# Patient Record
Sex: Female | Born: 1970 | Hispanic: No | Smoking: Current every day smoker
Health system: Southern US, Community
[De-identification: ages and names within clinical notes are randomized; demographics above are authoritative.]

## PROBLEM LIST (undated history)

## (undated) DIAGNOSIS — I10 Essential (primary) hypertension: Secondary | ICD-10-CM

## (undated) HISTORY — PX: CHOLECYSTECTOMY: SHX55

## (undated) HISTORY — PX: EYE SURGERY: SHX253

---

## 2006-06-17 ENCOUNTER — Emergency Department (HOSPITAL_COMMUNITY): Admission: EM | Admit: 2006-06-17 | Discharge: 2006-06-17 | Payer: Self-pay | Admitting: Emergency Medicine

## 2007-01-18 ENCOUNTER — Emergency Department (HOSPITAL_COMMUNITY): Admission: EM | Admit: 2007-01-18 | Discharge: 2007-01-18 | Payer: Self-pay | Admitting: Emergency Medicine

## 2009-12-21 ENCOUNTER — Emergency Department (HOSPITAL_COMMUNITY): Admission: EM | Admit: 2009-12-21 | Discharge: 2009-12-21 | Payer: Self-pay | Admitting: Emergency Medicine

## 2011-05-15 ENCOUNTER — Emergency Department (HOSPITAL_COMMUNITY)
Admission: EM | Admit: 2011-05-15 | Discharge: 2011-05-15 | Disposition: A | Discharge status: Home / Self Care | Payer: Self-pay | Attending: Emergency Medicine | Admitting: Emergency Medicine

## 2011-05-15 ENCOUNTER — Emergency Department (HOSPITAL_COMMUNITY): Payer: Self-pay

## 2011-05-15 DIAGNOSIS — K089 Disorder of teeth and supporting structures, unspecified: Secondary | ICD-10-CM | POA: Insufficient documentation

## 2011-05-15 DIAGNOSIS — R220 Localized swelling, mass and lump, head: Secondary | ICD-10-CM | POA: Insufficient documentation

## 2011-05-15 MED ORDER — IOHEXOL 300 MG/ML  SOLN
100.0000 mL | Freq: Once | INTRAMUSCULAR | Status: AC | PRN
  Administered 2011-05-15: 100 mL via INTRAVENOUS

## 2011-05-16 ENCOUNTER — Emergency Department (HOSPITAL_COMMUNITY)
Admission: EM | Admit: 2011-05-16 | Discharge: 2011-05-16 | Disposition: A | Discharge status: Home / Self Care | Payer: Self-pay | Attending: Emergency Medicine | Admitting: Emergency Medicine

## 2011-05-16 DIAGNOSIS — R220 Localized swelling, mass and lump, head: Secondary | ICD-10-CM | POA: Insufficient documentation

## 2011-05-16 DIAGNOSIS — K089 Disorder of teeth and supporting structures, unspecified: Secondary | ICD-10-CM | POA: Insufficient documentation

## 2011-05-16 LAB — POCT I-STAT, CHEM 8
BUN: 7 mg/dL (ref 6–23)
Calcium, Ion: 1.18 mmol/L (ref 1.12–1.32)
Chloride: 101 mEq/L (ref 96–112)
HCT: 46 % (ref 36.0–46.0)
TCO2: 23 mmol/L (ref 0–100)

## 2011-05-16 LAB — CBC
MCH: 31.1 pg (ref 26.0–34.0)
RBC: 4.72 MIL/uL (ref 3.87–5.11)
RDW: 12.6 % (ref 11.5–15.5)

## 2011-05-25 ENCOUNTER — Emergency Department (HOSPITAL_COMMUNITY)
Admission: EM | Admit: 2011-05-25 | Discharge: 2011-05-26 | Disposition: A | Discharge status: Home / Self Care | Payer: Self-pay | Attending: Emergency Medicine | Admitting: Emergency Medicine

## 2011-05-25 DIAGNOSIS — R599 Enlarged lymph nodes, unspecified: Secondary | ICD-10-CM | POA: Insufficient documentation

## 2011-05-25 DIAGNOSIS — K047 Periapical abscess without sinus: Secondary | ICD-10-CM | POA: Insufficient documentation

## 2011-05-25 DIAGNOSIS — R220 Localized swelling, mass and lump, head: Secondary | ICD-10-CM | POA: Insufficient documentation

## 2011-05-26 ENCOUNTER — Emergency Department (HOSPITAL_COMMUNITY): Payer: Self-pay

## 2011-05-26 ENCOUNTER — Encounter (HOSPITAL_COMMUNITY): Payer: Self-pay

## 2011-05-26 LAB — CBC
HCT: 31.8 % — ABNORMAL LOW (ref 36.0–46.0)
MCH: 30.2 pg (ref 26.0–34.0)
RDW: 12 % (ref 11.5–15.5)

## 2011-05-26 LAB — DIFFERENTIAL
Basophils Relative: 0 % (ref 0–1)
Eosinophils Absolute: 0.1 10*3/uL (ref 0.0–0.7)
Lymphocytes Relative: 25 % (ref 12–46)
Lymphs Abs: 2.7 10*3/uL (ref 0.7–4.0)
Monocytes Relative: 9 % (ref 3–12)

## 2011-05-26 LAB — BASIC METABOLIC PANEL
BUN: 8 mg/dL (ref 6–23)
Sodium: 136 mEq/L (ref 135–145)

## 2011-05-26 MED ORDER — IOHEXOL 300 MG/ML  SOLN
100.0000 mL | Freq: Once | INTRAMUSCULAR | Status: AC | PRN
  Administered 2011-05-26: 100 mL via INTRAVENOUS

## 2011-07-01 NOTE — Discharge Summary (Signed)
  NAME:  Emma, Ritter NO.:  [id number]  MEDICAL RECORD NO.:  [id number]  LOCATION:  [facility]                         FACILITY:  [facility]  PHYSICIAN:  Suzanna Obey, M.D.       DATE OF BIRTH:  [birth year]  DATE OF ADMISSION:  05/16/2011 DATE OF DISCHARGE:  05/16/2011                              DISCHARGE SUMMARY   I was called at 11:15 approximately by the physician assistant regarding a wisdom teeth removed on Thursday on this patient.  She began to tell me some of the information and I immediately asked why she was not calling all surgery, since it is clearly an oral  surgery problem and she said okay and hung up.          ______________________________ Suzanna Obey, M.D.     JB/MEDQ  D:  05/16/2011  T:  05/17/2011  Job:  [id number]  Electronically Signed by Suzanna Obey M.D. on 07/01/2011 09:17:06 AM

## 2012-11-10 IMAGING — CT CT MAXILLOFACIAL W/ CM
3 series · 16 of 47 positions shown, 19 images · IV contrast (agent unspecified)
Comparison: None.

CLINICAL DATA: 39-year-old with left sided cheek swelling. Recent
removal of the left wisdom tooth.

CT MAXILLOFACIAL WITH CONTRAST
TECHNIQUE: Multidetector CT imaging of the maxillofacial
structures was performed with intravenous contrast. Multiplanar CT
image reconstructions were also generated.
Contrast: 100 ml Lmnipaque-KFF

[Series 3: orbit 2.0 h32s · axial · 0.35mm/px · z∈[-250,-118]mm · 10 of 78 slices shown, 13 images]
[im 6/78  brain]
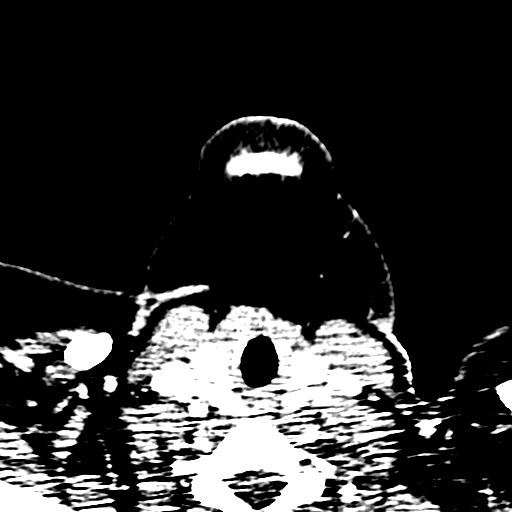
[im 6/78  bone]
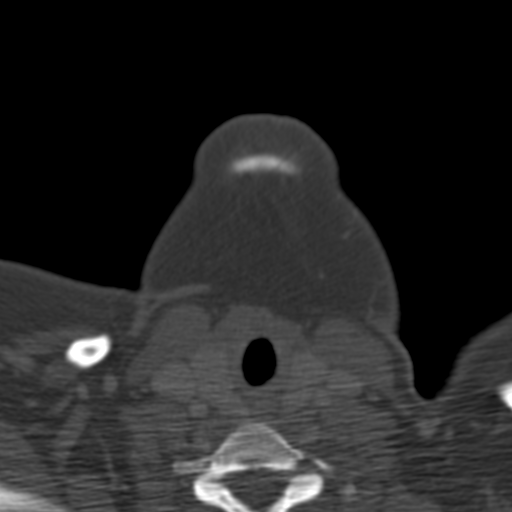
[im 14/78  bone]
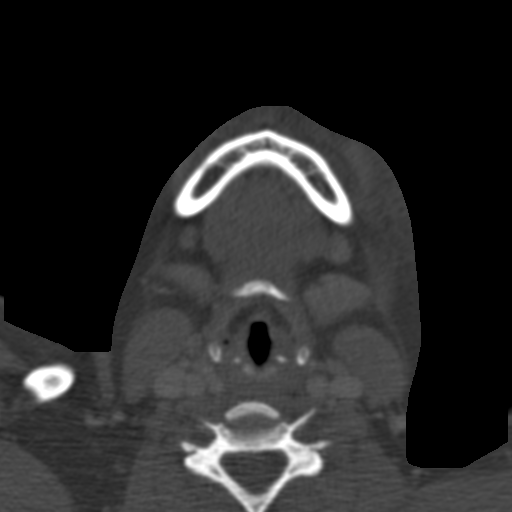
[im 22/78  bone]
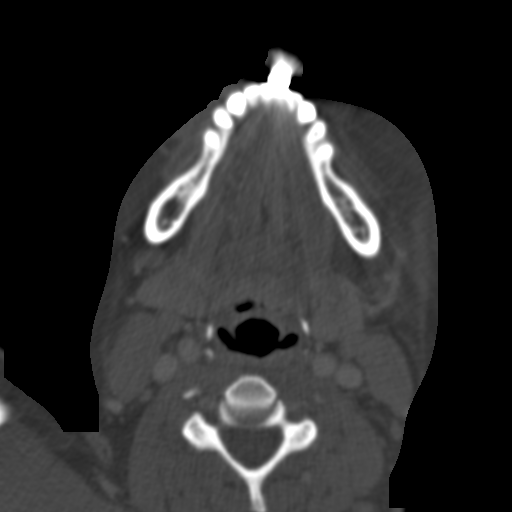
[im 27/78  bone]
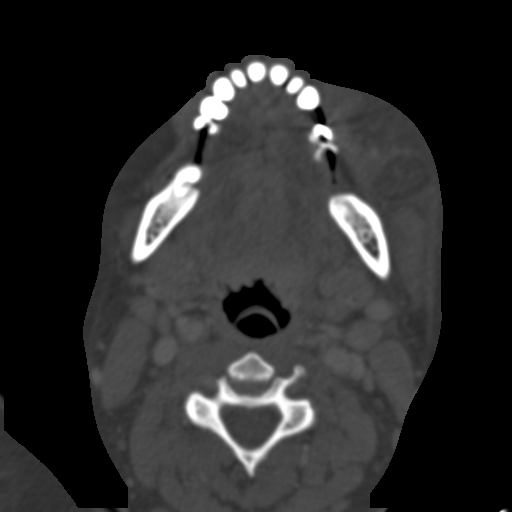
[im 35/78  brain]
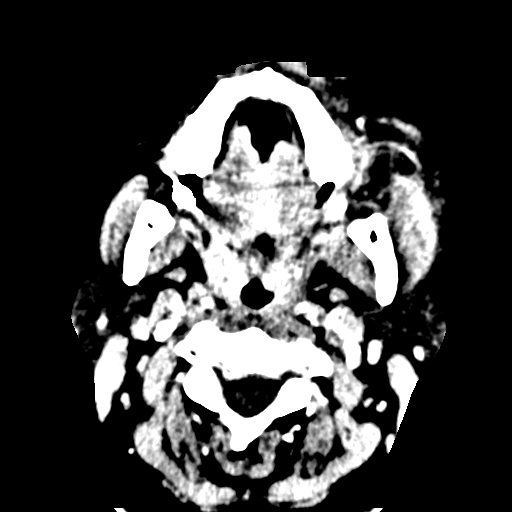
[im 35/78  bone]
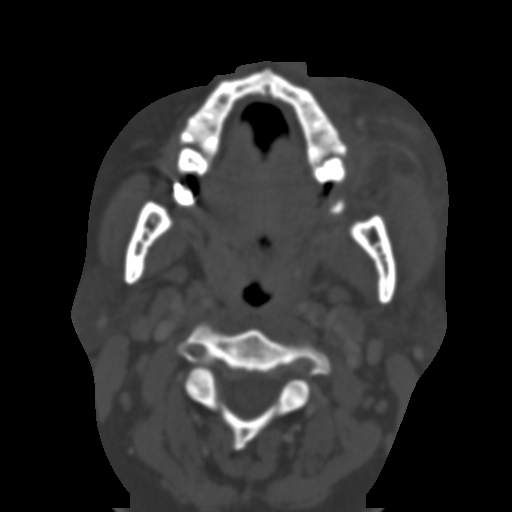
[im 43/78  bone]
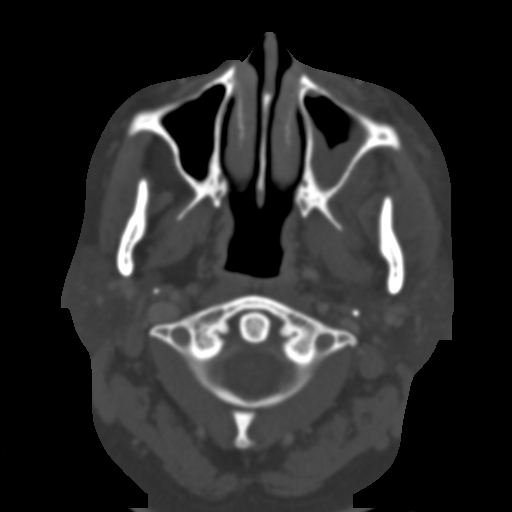
[im 51/78  bone]
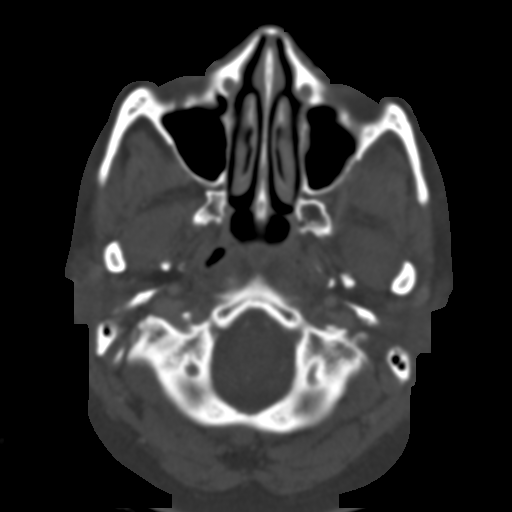
[im 59/78  bone]
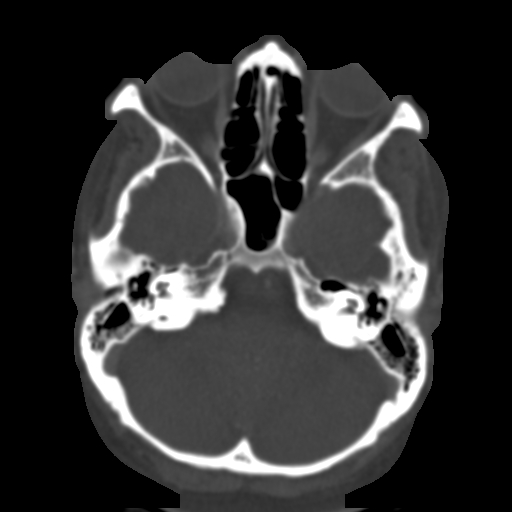
[im 64/78  brain]
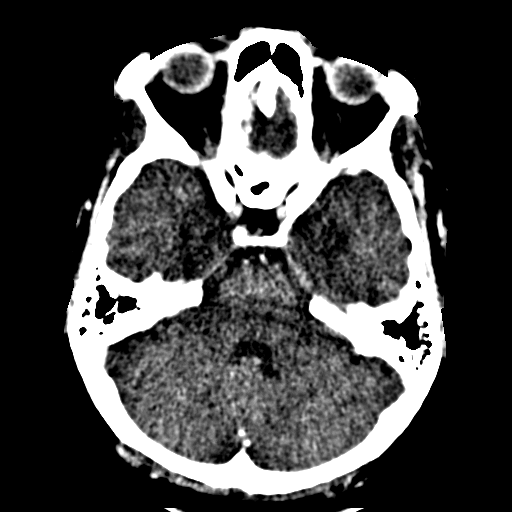
[im 64/78  bone]
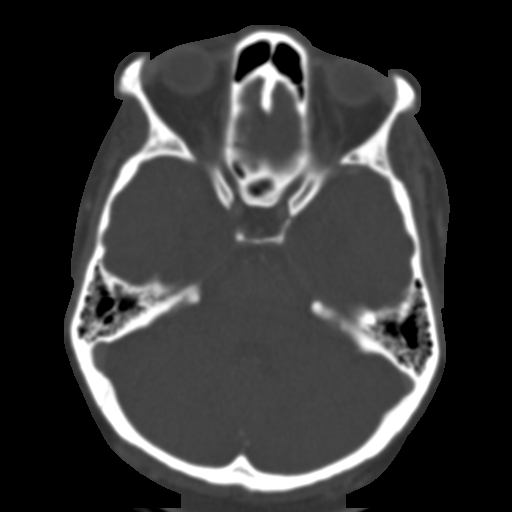
[im 72/78  bone]
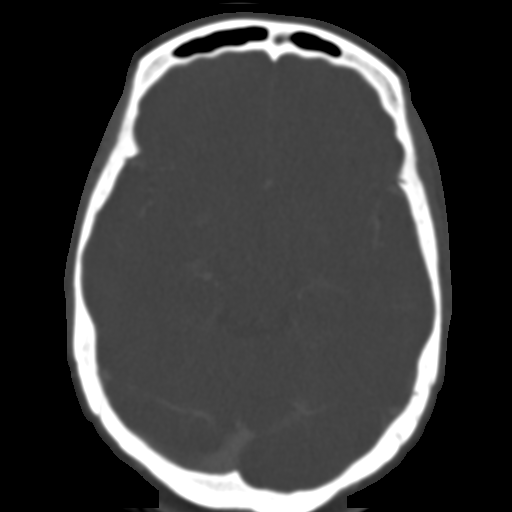

[Series 604: coronal soft tissue · coronal · 0.35mm/px · 3 of 94 slices shown]
[im 32/94  bone]
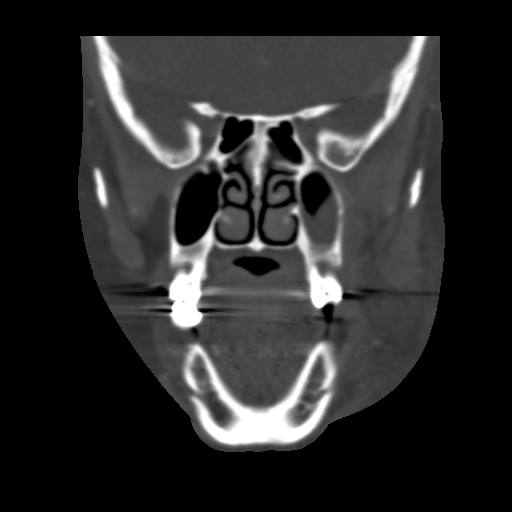
[im 42/94  bone]
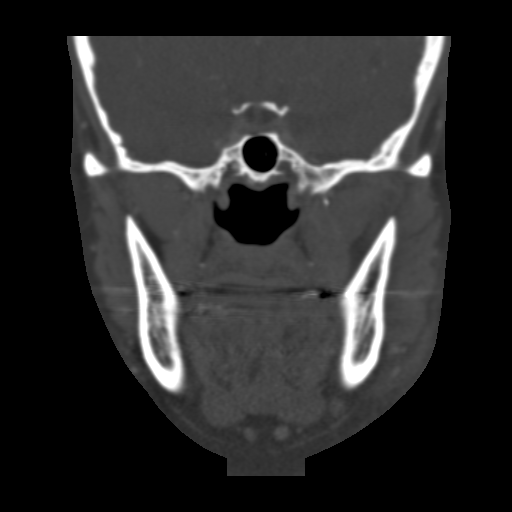
[im 52/94  bone]
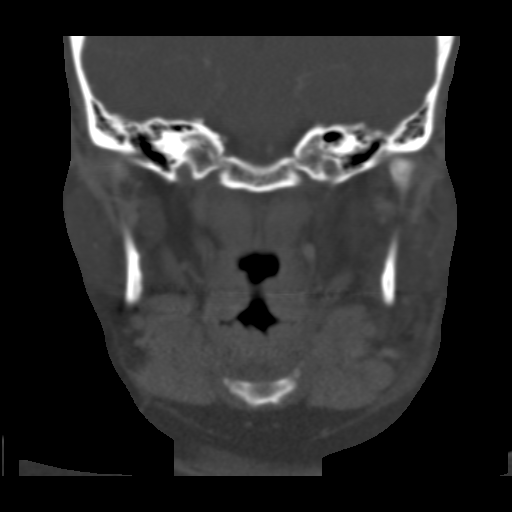

[Series 605: sagittal soft tissue · sagittal · 0.35mm/px · 3 of 77 slices shown]
[im 26/77  bone]
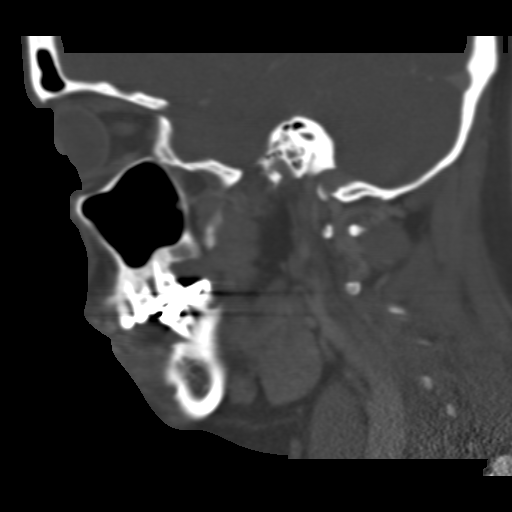
[im 39/77  bone]
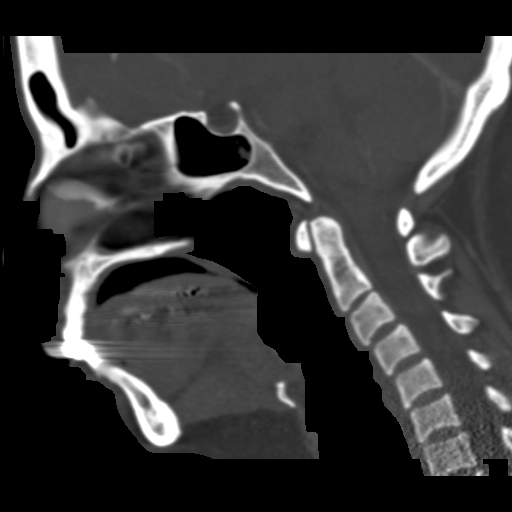
[im 51/77  bone]
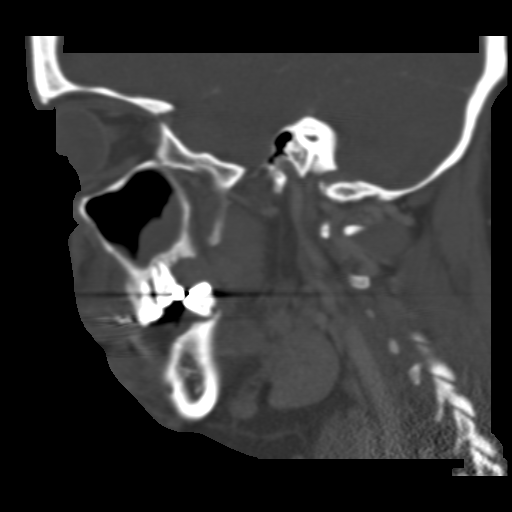

[16 of 47 positions shown; findings below may reference images not displayed]

FINDINGS: There is extensive soft tissue swelling throughout the
left side of the face.  There is focal soft tissue low density in
the anterior left cheek region best seen on sequence #3, image 25.
Findings could represent phlegmonous change but no evidence for a
discrete abscess. There is mild fat stranding in the left
parapharyngeal fat and prominent lymph nodes in the left
submandibular region.

The patient has had removal of the left upper third molar as well
as the left upper first bicuspid tooth.  The right upper third
molar has also been removed.  No evidence for periosteal reaction
involving the mandible or maxilla.  There is diffuse mucosal
thickening involving the left maxillary sinus.  The extraction site
of the left upper third molar is in very close proximity to the
floor of the left maxillary sinus which contains mucosal
thickening.  Mild mucosal disease involving the sphenoid sinuses.
No evidence for acute fracture.  Normal appearance of the globes
and orbits.  Normal appearance of the visualized intracranial
structures.
IMPRESSION: Edema involving the left side of the face and question a small area
of phlegmon adjacent to the left maxilla.  No evidence for a
discrete abscess collection. The source of the inflammation is
unclear but likely related to recent wisdom tooth extraction. The
left upper third molar extraction cavity is in very close proximity
to the left maxillary sinus which contains mucosal disease.  This
could potentially be a source of inflammation or infection.

## 2015-01-22 ENCOUNTER — Encounter (HOSPITAL_COMMUNITY): Payer: Self-pay

## 2015-01-22 ENCOUNTER — Emergency Department (HOSPITAL_COMMUNITY)
Admission: EM | Admit: 2015-01-22 | Discharge: 2015-01-22 | Disposition: A | Discharge status: Home / Self Care | Payer: Self-pay | Attending: Emergency Medicine | Admitting: Emergency Medicine

## 2015-01-22 DIAGNOSIS — Z880 Allergy status to penicillin: Secondary | ICD-10-CM | POA: Insufficient documentation

## 2015-01-22 DIAGNOSIS — H9202 Otalgia, left ear: Secondary | ICD-10-CM | POA: Insufficient documentation

## 2015-01-22 DIAGNOSIS — J020 Streptococcal pharyngitis: Secondary | ICD-10-CM | POA: Insufficient documentation

## 2015-01-22 DIAGNOSIS — Z720 Tobacco use: Secondary | ICD-10-CM | POA: Insufficient documentation

## 2015-01-22 LAB — RAPID STREP SCREEN (MED CTR MEBANE ONLY): Streptococcus, Group A Screen (Direct): POSITIVE — AB

## 2015-01-22 MED ORDER — CLINDAMYCIN HCL 150 MG PO CAPS: 300.0000 mg | ORAL_CAPSULE | Freq: Three times a day (TID) | ORAL | Status: DC

## 2015-01-22 MED ORDER — HYDROCODONE-ACETAMINOPHEN 7.5-325 MG/15ML PO SOLN: 10.0000 mL | Freq: Four times a day (QID) | ORAL | Status: DC | PRN

## 2015-01-22 NOTE — ED Provider Notes (Signed)
CSN: [id number]     Arrival date & time 01/22/15  1514 History  This chart was scribed for non-physician practitioner, Junius FinnerErin O'Malley, PA-C, working with Suzi RootsKevin E Steinl, MD by Milly JakobJohn Lee Graves, ED Scribe. The patient was seen in room WTR5/WTR5. Patient's care was started at 4:55 PM.   Chief Complaint  Patient presents with  . Sore Throat   The history is provided by the patient. No language interpreter was used.   HPI Comments: Emma Ritter is a 44 y.o. female who presents to the Emergency Department complaining of constant, aching, sore throat which began 3 days ago. She states that the pain is exacerbated by swallowing and drinking. She reports associated, subjective, fever and chills. She reports nausea 2 days ago and 1 episode of vomiting. She reports associated left ear pain. She reports taking Ibuprofen with minimal relief. She denies abdominal pain and headache. She reports an allergy to penicillin. She denies having a PCP.   History reviewed. No pertinent past medical history. Past Surgical History  Procedure Laterality Date  . Cholecystectomy    . Eye surgery     History reviewed. No pertinent family history. History  Substance Use Topics  . Smoking status: Current Some Day Smoker  . Smokeless tobacco: Not on file  . Alcohol Use: Yes     Comment: occ   OB History    No data available     Review of Systems  Constitutional: Positive for fever and chills.  HENT: Positive for ear pain and sore throat.   All other systems reviewed and are negative.  Allergies  Penicillins  Home Medications   Prior to Admission medications   Medication Sig Start Date End Date Taking? Authorizing Provider  ibuprofen (ADVIL,MOTRIN) 200 MG tablet Take 800 mg by mouth every 6 (six) hours as needed for moderate pain.   Yes Historical Provider, MD  clindamycin (CLEOCIN) 150 MG capsule Take 2 capsules (300 mg total) by mouth 3 (three) times daily. For 10 days 01/22/15   Junius FinnerErin O'Malley, PA-C   HYDROcodone-acetaminophen (HYCET) 7.5-325 mg/15 ml solution Take 10 mLs by mouth every 6 (six) hours as needed for moderate pain or severe pain. 01/22/15   Junius FinnerErin O'Malley, PA-C   Triage Vitals: BP 122/83 mmHg  Pulse 80  Temp(Src) 98.6 F (37 C) (Oral)  Resp 16  SpO2 100% Physical Exam  Constitutional: She is oriented to person, place, and time. She appears well-developed and well-nourished.  HENT:  Head: Normocephalic and atraumatic.  Right Ear: Hearing, tympanic membrane, external ear and ear canal normal.  Left Ear: Hearing, tympanic membrane, external ear and ear canal normal.  Nose: Nose normal.  Mouth/Throat: Uvula is midline and mucous membranes are normal. No trismus in the jaw. No uvula swelling. Oropharyngeal exudate, posterior oropharyngeal edema and posterior oropharyngeal erythema present.  Bilateral tonsillar erythema edema with exudates.   Eyes: EOM are normal.  Neck: Normal range of motion. Neck supple.  Cardiovascular: Normal rate, regular rhythm and normal heart sounds.   Pulmonary/Chest: Effort normal and breath sounds normal. No stridor. No respiratory distress. She has no wheezes. She has no rales. She exhibits no tenderness.  Abdominal: Soft. She exhibits no distension. There is no tenderness.  Musculoskeletal: Normal range of motion.  Neurological: She is alert and oriented to person, place, and time.  Skin: Skin is warm and dry.  Psychiatric: She has a normal mood and affect. Her behavior is normal.  Nursing note and vitals reviewed.   ED Course  Procedures (including critical care time) DIAGNOSTIC STUDIES: Oxygen Saturation is 100% on room air, normal by my interpretation.    COORDINATION OF CARE: 5:00 PM-Discussed treatment plan which includes ABX with pt at bedside and pt agreed to plan.   Labs Review Labs Reviewed  RAPID STREP SCREEN - Abnormal; Notable for the following:    Streptococcus, Group A Screen (Direct) POSITIVE (*)    All other components  within normal limits    Imaging Review No results found.   EKG Interpretation None      MDM   Final diagnoses:  Strep pharyngitis   Pt is a 44yo female, hx of sick contacts, c/o sore throat. Rapid strep: positive. No tonsillar abscess. Pt is PCN allergic. Will tx with Clindamycin. Home care instructions provided. Advised to f/u with PCP in 3-4 days if not improving. Return precautions provided. Pt verbalized understanding and agreement with tx plan.    I personally performed the services described in this documentation, which was scribed in my presence. The recorded information has been reviewed and is accurate.    Junius Finner, PA-C 01/23/15 1654  Suzi Roots, MD 01/25/15 [phone]

## 2015-01-22 NOTE — ED Notes (Signed)
Pt c/o increasing sore throat x 3 days.  Pain score 8/10.  Pt reports grandson was recently diagnosed w/ Strep.

## 2015-03-21 ENCOUNTER — Emergency Department (HOSPITAL_COMMUNITY)
Admission: EM | Admit: 2015-03-21 | Discharge: 2015-03-22 | Disposition: A | Discharge status: Home / Self Care | Payer: Self-pay | Attending: Emergency Medicine | Admitting: Emergency Medicine

## 2015-03-21 ENCOUNTER — Encounter (HOSPITAL_COMMUNITY): Payer: Self-pay | Admitting: Emergency Medicine

## 2015-03-21 DIAGNOSIS — Z720 Tobacco use: Secondary | ICD-10-CM | POA: Insufficient documentation

## 2015-03-21 DIAGNOSIS — Z880 Allergy status to penicillin: Secondary | ICD-10-CM | POA: Insufficient documentation

## 2015-03-21 DIAGNOSIS — Z202 Contact with and (suspected) exposure to infections with a predominantly sexual mode of transmission: Secondary | ICD-10-CM | POA: Insufficient documentation

## 2015-03-21 DIAGNOSIS — Z792 Long term (current) use of antibiotics: Secondary | ICD-10-CM | POA: Insufficient documentation

## 2015-03-21 NOTE — ED Notes (Addendum)
Pt reports having intercourse and being told by partner that his MD dx with an STD. Pt unsure of STD she was actually exposed to. Pt reports a couple reports feeling slightly nauseated.

## 2015-03-22 LAB — WET PREP, GENITAL
Clue Cells Wet Prep HPF POC: NONE SEEN
Trich, Wet Prep: NONE SEEN
YEAST WET PREP: NONE SEEN

## 2015-03-22 LAB — URINE MICROSCOPIC-ADD ON

## 2015-03-22 LAB — URINALYSIS, ROUTINE W REFLEX MICROSCOPIC
Bilirubin Urine: NEGATIVE
Glucose, UA: NEGATIVE mg/dL
Ketones, ur: NEGATIVE mg/dL
LEUKOCYTES UA: NEGATIVE
NITRITE: NEGATIVE
Protein, ur: NEGATIVE mg/dL
Specific Gravity, Urine: 1.016 (ref 1.005–1.030)
Urobilinogen, UA: 0.2 mg/dL (ref 0.0–1.0)
pH: 5.5 (ref 5.0–8.0)

## 2015-03-22 MED ORDER — LIDOCAINE HCL (PF) 1 % IJ SOLN
INTRAMUSCULAR | Status: AC
  Administered 2015-03-22: 5 mL
  Filled 2015-03-22: qty 5

## 2015-03-22 MED ORDER — CEFTRIAXONE SODIUM 250 MG IJ SOLR
250.0000 mg | Freq: Once | INTRAMUSCULAR | Status: AC
  Administered 2015-03-22: 250 mg via INTRAMUSCULAR
  Filled 2015-03-22: qty 250

## 2015-03-22 MED ORDER — AZITHROMYCIN 250 MG PO TABS
1000.0000 mg | ORAL_TABLET | Freq: Once | ORAL | Status: AC
  Administered 2015-03-22: 1000 mg via ORAL
  Filled 2015-03-22: qty 4

## 2015-03-22 NOTE — ED Notes (Signed)
Awake. Verbally responsive. A/O x4. Resp even and unlabored. No audible adventitious breath sounds noted. ABC's intact. Waiting on UA results.

## 2015-03-22 NOTE — ED Notes (Signed)
Awake. Verbally responsive. A/O x4. Resp even and unlabored. No audible adventitious breath sounds noted. ABC's intact.  

## 2015-03-22 NOTE — ED Provider Notes (Signed)
CSN: [id number]     Arrival date & time 03/21/15  2254 History   First MD Initiated Contact with Patient 03/21/15 2358     Chief Complaint  Patient presents with  . Exposure to STD     (Consider location/radiation/quality/duration/timing/severity/associated sxs/prior Treatment) Patient is a 44 y.o. female presenting with STD exposure.  Exposure to STD This is a new problem. The problem occurs constantly. The problem has not changed since onset.Nothing aggravates the symptoms. Nothing relieves the symptoms. She has tried nothing for the symptoms.    History reviewed. No pertinent past medical history. Past Surgical History  Procedure Laterality Date  . Cholecystectomy    . Eye surgery     History reviewed. No pertinent family history. History  Substance Use Topics  . Smoking status: Current Some Day Smoker  . Smokeless tobacco: Not on file  . Alcohol Use: Yes     Comment: occ   OB History    No data available     Review of Systems  All other systems reviewed and are negative.     Allergies  Penicillins  Home Medications   Prior to Admission medications   Medication Sig Start Date End Date Taking? Authorizing Provider  clindamycin (CLEOCIN) 150 MG capsule Take 2 capsules (300 mg total) by mouth 3 (three) times daily. For 10 days 01/22/15  Yes Junius FinnerErin O'Malley, PA-C  HYDROcodone-acetaminophen (HYCET) 7.5-325 mg/15 ml solution Take 10 mLs by mouth every 6 (six) hours as needed for moderate pain or severe pain. 01/22/15  Yes Junius FinnerErin O'Malley, PA-C  ibuprofen (ADVIL,MOTRIN) 200 MG tablet Take 800 mg by mouth every 6 (six) hours as needed for moderate pain.   Yes Historical Provider, MD   BP 145/71 mmHg  Pulse 78  Temp(Src) 98.3 F (36.8 C) (Oral)  Resp 18  SpO2 99%  LMP 03/10/2015 Physical Exam  Constitutional: She is oriented to person, place, and time. She appears well-developed and well-nourished.  HENT:  Head: Normocephalic and atraumatic.  Right Ear: External ear  normal.  Left Ear: External ear normal.  Eyes: Conjunctivae and EOM are normal. Pupils are equal, round, and reactive to light.  Neck: Normal range of motion. Neck supple.  Cardiovascular: Normal rate, regular rhythm, normal heart sounds and intact distal pulses.   Pulmonary/Chest: Effort normal and breath sounds normal.  Abdominal: Soft. Bowel sounds are normal. There is no tenderness.  Genitourinary: Cervix exhibits discharge (white). Cervix exhibits no motion tenderness and no friability. Right adnexum displays no tenderness. Left adnexum displays no tenderness.  Musculoskeletal: Normal range of motion.  Neurological: She is alert and oriented to person, place, and time.  Skin: Skin is warm and dry.  Vitals reviewed.   ED Course  Procedures (including critical care time) Labs Review Labs Reviewed  WET PREP, GENITAL - Abnormal; Notable for the following:    WBC, Wet Prep HPF POC FEW (*)    All other components within normal limits  URINALYSIS, ROUTINE W REFLEX MICROSCOPIC - Abnormal; Notable for the following:    APPearance CLOUDY (*)    Hgb urine dipstick TRACE (*)    All other components within normal limits  URINE MICROSCOPIC-ADD ON - Abnormal; Notable for the following:    Squamous Epithelial / LPF MANY (*)    All other components within normal limits  GC/CHLAMYDIA PROBE AMP ([place])    Imaging Review No results found.   EKG Interpretation None      MDM   Final diagnoses:  Exposure to  STD    44 y.o. female without pertinent PMH presents after exposure to unknown STD.  States partner (unprotected intercourse) called and told her he had an STD which could take an antibiotic for cure. She does not know anything further. Physical exam today as above.  Empirically given rocephin/azithromycin.  DC home in stable condition.  I have reviewed all laboratory and imaging studies if ordered as above  1. Exposure to STD         Mirian Mo, MD 03/22/15 [phone]

## 2015-03-22 NOTE — Discharge Instructions (Signed)

## 2015-03-22 NOTE — ED Notes (Signed)
Pelvic supplies at bedside. 

## 2015-03-24 LAB — GC/CHLAMYDIA PROBE AMP (~~LOC~~) NOT AT ARMC
Chlamydia: NEGATIVE
Neisseria Gonorrhea: NEGATIVE

## 2018-03-30 DIAGNOSIS — J039 Acute tonsillitis, unspecified: Secondary | ICD-10-CM | POA: Diagnosis not present

## 2018-12-29 ENCOUNTER — Ambulatory Visit (INDEPENDENT_AMBULATORY_CARE_PROVIDER_SITE_OTHER): Payer: BLUE CROSS/BLUE SHIELD | Admitting: Emergency Medicine

## 2018-12-29 ENCOUNTER — Encounter: Payer: Self-pay | Admitting: Emergency Medicine

## 2018-12-29 ENCOUNTER — Other Ambulatory Visit: Payer: Self-pay

## 2018-12-29 VITALS — BP 158/90 | HR 72 | Temp 98.3°F | Ht 64.0 in | Wt 193.4 lb

## 2018-12-29 DIAGNOSIS — R05 Cough: Secondary | ICD-10-CM

## 2018-12-29 DIAGNOSIS — R059 Cough, unspecified: Secondary | ICD-10-CM | POA: Insufficient documentation

## 2018-12-29 DIAGNOSIS — J029 Acute pharyngitis, unspecified: Secondary | ICD-10-CM | POA: Insufficient documentation

## 2018-12-29 MED ORDER — AZITHROMYCIN 250 MG PO TABS: ORAL_TABLET | ORAL | 0 refills | Status: AC

## 2018-12-29 NOTE — Patient Instructions (Addendum)
   If you have lab work done today you will be contacted with your lab results within the next 2 weeks.  If you have not heard from us then please contact us. The fastest way to get your results is to register for My Chart.   IF you received an x-ray today, you will receive an invoice from [facility]. Please contact [facility] at [phone] with questions or concerns regarding your invoice.   IF you received labwork today, you will receive an invoice from [facility]. Please contact [facility] at [phone] with questions or concerns regarding your invoice.   Our billing staff will not be able to assist you with questions regarding bills from these companies.  You will be contacted with the lab results as soon as they are available. The fastest way to get your results is to activate your My Chart account. Instructions are located on the last page of this paperwork. If you have not heard from us regarding the results in 2 weeks, please contact this office.     Sore Throat When you have a sore throat, your throat may feel:  Tender.  Burning.  Irritated.  Scratchy.  Painful when you swallow.  Painful when you talk. Many things can cause a sore throat, such as:  An infection.  Allergies.  Dry air.  Smoke or pollution.  Radiation treatment.  Gastroesophageal reflux disease (GERD).  A tumor. A sore throat can be the first sign of another sickness. It can happen with other problems, like:  Coughing.  Sneezing.  Fever.  Swelling in the neck. Most sore throats go away without treatment. Follow these instructions at home:      Take over-the-counter medicines only as told by your doctor. ? If your child has a sore throat, do not give your child aspirin.  Drink enough fluids to keep your pee (urine) pale yellow.  Rest when you feel you need to.  To help with pain: ? Sip warm liquids, such as broth, herbal tea, or warm water. ? Eat or drink  cold or frozen liquids, such as frozen ice pops. ? Gargle with a salt-water mixture 3-4 times a day or as needed. To make a salt-water mixture, add -1 tsp (3-6 g) of salt to 1 cup (237 mL) of warm water. Mix it until you cannot see the salt anymore. ? Suck on hard candy or throat lozenges. ? Put a cool-mist humidifier in your bedroom at night. ? Sit in the bathroom with the door closed for 5-10 minutes while you run hot water in the shower.  Do not use any products that contain nicotine or tobacco, such as cigarettes, e-cigarettes, and chewing tobacco. If you need help quitting, ask your doctor.  Wash your hands well and often with soap and water. If soap and water are not available, use hand sanitizer. Contact a doctor if:  You have a fever for more than 2-3 days.  You keep having symptoms for more than 2-3 days.  Your throat does not get better in 7 days.  You have a fever and your symptoms suddenly get worse.  Your child who is 3 months to 3 years old has a temperature of 102.2F (39C) or higher. Get help right away if:  You have trouble breathing.  You cannot swallow fluids, soft foods, or your saliva.  You have swelling in your throat or neck that gets worse.  You keep feeling sick to your stomach (nauseous).  You keep throwing up (  vomiting). Summary  A sore throat is pain, burning, irritation, or scratchiness in the throat. Many things can cause a sore throat.  Take over-the-counter medicines only as told by your doctor. Do not give your child aspirin.  Drink plenty of fluids, and rest as needed.  Contact a doctor if your symptoms get worse or your sore throat does not get better within 7 days. This information is not intended to replace advice given to you by your health care provider. Make sure you discuss any questions you have with your health care provider. Document Released: 08/31/2008 Document Revised: 04/24/2018 Document Reviewed: 04/24/2018 [employer]vier  Interactive Patient Education  2019 [employer]  

## 2018-12-29 NOTE — Progress Notes (Signed)
Emma Ritter 48 y.o.   Chief Complaint  Patient presents with  . left side ear and throat pain    going on 3 weeks ()  . Cough    HISTORY OF PRESENT ILLNESS: This is a 48 y.o. female complaining of 2-week history of nasal congestion, sore throat, cough, left ear pain.  Not getting better.  No other associated symptoms.  Sore Throat   This is a new problem. The current episode started 1 to 4 weeks ago. The problem has been gradually worsening. The pain is worse on the left side. There has been no fever. The pain is at a severity of 5/10. The pain is moderate. Associated symptoms include congestion, coughing, ear pain, swollen glands and trouble swallowing. Pertinent negatives include no abdominal pain, diarrhea, drooling, headaches, neck pain, shortness of breath or vomiting. She has tried nothing for the symptoms.     Prior to Admission medications   Not on File    Allergies  Allergen Reactions  . Ciprofloxacin     swelling  . Penicillins Hives and Swelling    There are no active problems to display for this patient.   No past medical history on file.  Past Surgical History:  Procedure Laterality Date  . CHOLECYSTECTOMY    . EYE SURGERY      Social History   Socioeconomic History  . Marital status: Widowed    Spouse name: Not on file  . Number of children: Not on file  . Years of education: Not on file  . Highest education level: Not on file  Occupational History  . Not on file  Social Needs  . Financial resource strain: Not on file  . Food insecurity:    Worry: Not on file    Inability: Not on file  . Transportation needs:    Medical: Not on file    Non-medical: Not on file  Tobacco Use  . Smoking status: Current Some Day Smoker  Substance and Sexual Activity  . Alcohol use: Yes    Comment: occ  . Drug use: No  . Sexual activity: Yes    Birth control/protection: None  Lifestyle  . Physical activity:    Days per week: Not on file    Minutes  per session: Not on file  . Stress: Not on file  Relationships  . Social connections:    Talks on phone: Not on file    Gets together: Not on file    Attends religious service: Not on file    Active member of club or organization: Not on file    Attends meetings of clubs or organizations: Not on file    Relationship status: Not on file  . Intimate partner violence:    Fear of current or ex partner: Not on file    Emotionally abused: Not on file    Physically abused: Not on file    Forced sexual activity: Not on file  Other Topics Concern  . Not on file  Social History Narrative  . Not on file    No family history on file.   Review of Systems  Constitutional: Negative.   HENT: Positive for congestion, ear pain and trouble swallowing. Negative for drooling.   Eyes: Negative.  Negative for discharge and redness.  Respiratory: Positive for cough. Negative for shortness of breath.   Cardiovascular: Negative.  Negative for palpitations.  Gastrointestinal: Negative.  Negative for abdominal pain, diarrhea, nausea and vomiting.  Genitourinary: Negative.  Negative for  dysuria and hematuria.  Musculoskeletal: Negative.  Negative for back pain and neck pain.  Skin: Negative.   Neurological: Negative.  Negative for dizziness and headaches.  Endo/Heme/Allergies: Negative.     Vitals:   12/29/18 1538 12/29/18 1553  BP: (!) 166/77 (!) 158/90  Pulse: 72   Temp: 98.3 F (36.8 C)   SpO2: 97%     Physical Exam Vitals signs reviewed.  Constitutional:      Appearance: Normal appearance.  HENT:     Head: Normocephalic and atraumatic.     Right Ear: Tympanic membrane, ear canal and external ear normal.     Left Ear: Tympanic membrane is erythematous.     Mouth/Throat:     Mouth: Mucous membranes are moist.     Pharynx: Oropharynx is clear. Posterior oropharyngeal erythema present. No oropharyngeal exudate.  Eyes:     Extraocular Movements: Extraocular movements intact.      Conjunctiva/sclera: Conjunctivae normal.     Pupils: Pupils are equal, round, and reactive to light.  Neck:     Musculoskeletal: Normal range of motion and neck supple.  Cardiovascular:     Rate and Rhythm: Normal rate and regular rhythm.     Heart sounds: Normal heart sounds.  Pulmonary:     Effort: Pulmonary effort is normal.     Breath sounds: Normal breath sounds.  Musculoskeletal: Normal range of motion.  Lymphadenopathy:     Cervical: Cervical adenopathy present.  Skin:    General: Skin is warm and dry.     Capillary Refill: Capillary refill takes less than 2 seconds.  Neurological:     General: No focal deficit present.     Mental Status: She is alert and oriented to person, place, and time.  Psychiatric:        Mood and Affect: Mood normal.        Behavior: Behavior normal.      ASSESSMENT & PLAN: Emma Ritter was seen today for left side ear and throat pain and cough.  Diagnoses and all orders for this visit:  Sore throat  Acute pharyngitis, unspecified etiology  Cough    Patient Instructions       If you have lab work done today you will be contacted with your lab results within the next 2 weeks.  If you have not heard from [place] then please contact us. The fastest way to get your results is to register for My Chart.   IF you received an x-ray today, you will receive an invoice from [facility]. Please contact [facility] at [phone] with questions or concerns regarding your invoice.   IF you received labwork today, you will receive an invoice from [place]. Please contact [facility] at [phone] with questions or concerns regarding your invoice.   Our billing staff will not be able to assist you with questions regarding bills from these companies.  You will be contacted with the lab results as soon as they are available. The fastest way to get your results is to activate your My Chart account. Instructions are located on the last page of  this paperwork. If you have not heard from [place] regarding the results in 2 weeks, please contact this office.      Sore Throat When you have a sore throat, your throat may feel:  Tender.  Burning.  Irritated.  Scratchy.  Painful when you swallow.  Painful when you talk. Many things can cause a sore throat, such as:  An infection.  Allergies.  Dry  air.  Smoke or pollution.  Radiation treatment.  Gastroesophageal reflux disease (GERD).  A tumor. A sore throat can be the first sign of another sickness. It can happen with other problems, like:  Coughing.  Sneezing.  Fever.  Swelling in the neck. Most sore throats go away without treatment. Follow these instructions at home:      Take over-the-counter medicines only as told by your doctor. ? If your child has a sore throat, do not give your child aspirin.  Drink enough fluids to keep your pee (urine) pale yellow.  Rest when you feel you need to.  To help with pain: ? Sip warm liquids, such as broth, herbal tea, or warm water. ? Eat or drink cold or frozen liquids, such as frozen ice pops. ? Gargle with a salt-water mixture 3-4 times a day or as needed. To make a salt-water mixture, add -1 tsp (3-6 g) of salt to 1 cup (237 mL) of warm water. Mix it until you cannot see the salt anymore. ? Suck on hard candy or throat lozenges. ? Put a cool-mist humidifier in your bedroom at night. ? Sit in the bathroom with the door closed for 5-10 minutes while you run hot water in the shower.  Do not use any products that contain nicotine or tobacco, such as cigarettes, e-cigarettes, and chewing tobacco. If you need help quitting, ask your doctor.  Wash your hands well and often with soap and water. If soap and water are not available, use hand sanitizer. Contact a doctor if:  You have a fever for more than 2-3 days.  You keep having symptoms for more than 2-3 days.  Your throat does not get better in 7 days.  You  have a fever and your symptoms suddenly get worse.  Your child who is 3 months to 47 years old has a temperature of 102.57F (39C) or higher. Get help right away if:  You have trouble breathing.  You cannot swallow fluids, soft foods, or your saliva.  You have swelling in your throat or neck that gets worse.  You keep feeling sick to your stomach (nauseous).  You keep throwing up (vomiting). Summary  A sore throat is pain, burning, irritation, or scratchiness in the throat. Many things can cause a sore throat.  Take over-the-counter medicines only as told by your doctor. Do not give your child aspirin.  Drink plenty of fluids, and rest as needed.  Contact a doctor if your symptoms get worse or your sore throat does not get better within 7 days. This information is not intended to replace advice given to you by your health care provider. Make sure you discuss any questions you have with your health care provider. Document Released: 08/31/2008 Document Revised: 04/24/2018 Document Reviewed: 04/24/2018 Elsevier Interactive Patient Education  2019 Elsevier Inc.      Edwina Barth, MD [facility]

## 2020-04-02 DIAGNOSIS — R5383 Other fatigue: Secondary | ICD-10-CM | POA: Diagnosis not present

## 2020-04-02 DIAGNOSIS — I10 Essential (primary) hypertension: Secondary | ICD-10-CM | POA: Diagnosis not present

## 2020-04-02 DIAGNOSIS — Z131 Encounter for screening for diabetes mellitus: Secondary | ICD-10-CM | POA: Diagnosis not present

## 2020-04-02 DIAGNOSIS — Z0000 Encounter for general adult medical examination without abnormal findings: Secondary | ICD-10-CM | POA: Diagnosis not present

## 2020-04-02 DIAGNOSIS — Z13220 Encounter for screening for lipoid disorders: Secondary | ICD-10-CM | POA: Diagnosis not present

## 2020-04-02 DIAGNOSIS — E559 Vitamin D deficiency, unspecified: Secondary | ICD-10-CM | POA: Diagnosis not present

## 2020-04-02 DIAGNOSIS — Z114 Encounter for screening for human immunodeficiency virus [HIV]: Secondary | ICD-10-CM | POA: Diagnosis not present

## 2020-04-02 DIAGNOSIS — Z03818 Encounter for observation for suspected exposure to other biological agents ruled out: Secondary | ICD-10-CM | POA: Diagnosis not present

## 2020-04-22 DIAGNOSIS — E559 Vitamin D deficiency, unspecified: Secondary | ICD-10-CM | POA: Diagnosis not present

## 2020-04-22 DIAGNOSIS — G4700 Insomnia, unspecified: Secondary | ICD-10-CM | POA: Diagnosis not present

## 2020-04-22 DIAGNOSIS — R7303 Prediabetes: Secondary | ICD-10-CM | POA: Diagnosis not present

## 2020-04-22 DIAGNOSIS — E785 Hyperlipidemia, unspecified: Secondary | ICD-10-CM | POA: Diagnosis not present

## 2022-01-25 ENCOUNTER — Other Ambulatory Visit: Payer: Self-pay | Admitting: Internal Medicine

## 2022-01-25 DIAGNOSIS — Z1231 Encounter for screening mammogram for malignant neoplasm of breast: Secondary | ICD-10-CM

## 2023-05-16 ENCOUNTER — Emergency Department (HOSPITAL_COMMUNITY): Payer: BC Managed Care – PPO

## 2023-05-16 ENCOUNTER — Encounter (HOSPITAL_COMMUNITY): Payer: Self-pay

## 2023-05-16 ENCOUNTER — Emergency Department (HOSPITAL_COMMUNITY)
Admission: EM | Admit: 2023-05-16 | Discharge: 2023-05-16 | Disposition: A | Discharge status: Home / Self Care | Payer: BC Managed Care – PPO | Attending: Emergency Medicine | Admitting: Emergency Medicine

## 2023-05-16 ENCOUNTER — Other Ambulatory Visit: Payer: Self-pay

## 2023-05-16 DIAGNOSIS — R0789 Other chest pain: Secondary | ICD-10-CM | POA: Insufficient documentation

## 2023-05-16 HISTORY — DX: Essential (primary) hypertension: I10

## 2023-05-16 LAB — BASIC METABOLIC PANEL
Anion gap: 9 (ref 5–15)
BUN: 11 mg/dL (ref 6–20)
CO2: 24 mmol/L (ref 22–32)
Calcium: 8.8 mg/dL — ABNORMAL LOW (ref 8.9–10.3)
Chloride: 104 mmol/L (ref 98–111)
Creatinine, Ser: 0.81 mg/dL (ref 0.44–1.00)
GFR, Estimated: >60 mL/min (ref >60)
Glucose, Bld: 105 mg/dL — ABNORMAL HIGH (ref 70–99)
Potassium: 3.5 mmol/L (ref 3.5–5.1)
Sodium: 137 mmol/L (ref 135–145)

## 2023-05-16 LAB — BRAIN NATRIURETIC PEPTIDE: B Natriuretic Peptide: 26 pg/mL (ref 0.0–100.0)

## 2023-05-16 LAB — CBC
HCT: 42.3 % (ref 36.0–46.0)
Hemoglobin: 14.1 g/dL (ref 12.0–15.0)
MCH: 30.2 pg (ref 26.0–34.0)
MCHC: 33.3 g/dL (ref 30.0–36.0)
MCV: 90.6 fL (ref 80.0–100.0)
Platelets: 209 10*3/uL (ref 150–400)
RBC: 4.67 MIL/uL (ref 3.87–5.11)
RDW: 13.2 % (ref 11.5–15.5)
WBC: 8.3 10*3/uL (ref 4.0–10.5)
nRBC: 0 % (ref 0.0–0.2)

## 2023-05-16 LAB — PREGNANCY, URINE: Preg Test, Ur: NEGATIVE

## 2023-05-16 LAB — TROPONIN I (HIGH SENSITIVITY): Troponin I (High Sensitivity): 3 ng/L (ref <18)

## 2023-05-16 NOTE — Discharge Instructions (Signed)
Please follow-up with the primary care provider I have attached you for regarding recent ER visit and symptoms.  I have also given you a GI specialist to follow-up with so you may have long-term treatment of your acid reflux.  You may take Tylenol every 6 hours as needed for pain and continue use your Pepcid as prescribed.  Today your lab workup was reassuring and you most likely have musculoskeletal pain causing your symptoms.  If symptoms change or worsen please return to ER.

## 2023-05-16 NOTE — ED Triage Notes (Addendum)
Pt c/o L chest/shoulder pain and SOB x3 days.  Pain score 6/10 increasing w/ movement and deep breathing.  Pt reports pain and SOB increases w/ laying flat.  Hx of HTN and noncompliant w/ meds.       Pt reports pain began as a tightness in her center chest.

## 2023-05-16 NOTE — ED Provider Notes (Signed)
[place] EMERGENCY DEPARTMENT AT [facility] Provider Note   CSN: [id number] Arrival date & time: 05/16/23  1914     History  Chief Complaint  Patient presents with   Chest Pain   Shoulder Pain    Emma Ritter is a 52 y.o. female status post cholecystectomy, history of acid reflux presented with 3 days of left-sided chest/shoulder pain with associated shortness of breath.  Patient states that she woke up 1 day and began endorsing symptoms.  Patient states that she has to be propped up when lying down and otherwise she feels short of breath.  Patient has very little left-sided chest pain/shoulder pain is exacerbated movement and palpation.  Patient denies any recent surgeries, hospitalizations, travel or any leg swelling.  Patient denies any history of blood clots and states that her chest/shoulder pain is not pleuritic.  Patient states she has not seen a GI specialist or primary care provider in the past year as she did not like the medications she was on as it would exacerbate her acid reflux.  Patient is currently only on Pepcid 10 times daily.  Patient denied history of CHF.  Patient denied nausea/vomiting, recent trauma, decreased sensation, changes sensation/motor skills, skin color changes, estrogen use, hemoptysis  Home Medications Prior to Admission medications   Medication Sig Start Date End Date Taking? Authorizing Provider  azithromycin (ZITHROMAX) 250 MG tablet Sig as indicated 12/29/18   Georgina Quint, MD      Allergies    Ciprofloxacin and Penicillins    Review of Systems   Review of Systems  Cardiovascular:  Positive for chest pain.    Physical Exam Updated Vital Signs BP (!) 169/84   Pulse (!) 50   Temp 98.3 F (36.8 C) (Oral)   Resp 14   Ht 5\' 4"  (1.626 m)   Wt 77.1 kg   SpO2 100%   BMI 29.18 kg/m  Physical Exam Vitals reviewed.  Constitutional:      General: She is not in acute distress. HENT:     Head: Normocephalic and  atraumatic.  Eyes:     Extraocular Movements: Extraocular movements intact.     Conjunctiva/sclera: Conjunctivae normal.     Pupils: Pupils are equal, round, and reactive to light.  Cardiovascular:     Rate and Rhythm: Normal rate and regular rhythm.     Pulses: Normal pulses.     Heart sounds: Normal heart sounds.     Comments: 2+ bilateral radial/dorsalis pedis pulses with regular rate Pulmonary:     Effort: Pulmonary effort is normal. No respiratory distress.     Breath sounds: Normal breath sounds.  Abdominal:     Palpations: Abdomen is soft.     Tenderness: There is no abdominal tenderness. There is no guarding or rebound.  Musculoskeletal:        General: Normal range of motion.     Cervical back: Normal range of motion and neck supple.     Right lower leg: No edema.     Left lower leg: No edema.     Comments: 5 out of 5 bilateral grip/leg extension strength Chest pain reproducible with palpation of the left side without abnormalities palpated No calf tenderness  Skin:    General: Skin is warm and dry.     Capillary Refill: Capillary refill takes less than 2 seconds.     Comments: No overlying skin color changes  Neurological:     General: No focal deficit present.  Mental Status: She is alert and oriented to person, place, and time.     Comments: Sensation intact in all 4 limbs  Psychiatric:        Mood and Affect: Mood normal.     ED Results / Procedures / Treatments   Labs (all labs ordered are listed, but only abnormal results are displayed) Labs Reviewed  BASIC METABOLIC PANEL - Abnormal; Notable for the following components:      Result Value   Glucose, Bld 105 (*)    Calcium 8.8 (*)    All other components within normal limits  CBC  BRAIN NATRIURETIC PEPTIDE  PREGNANCY, URINE  TROPONIN I (HIGH SENSITIVITY)  TROPONIN I (HIGH SENSITIVITY)    EKG None  Radiology DG Shoulder Left Portable  Result Date: 05/16/2023 CLINICAL DATA:  L chest pain  EXAM: LEFT SHOULDER COMPARISON:  None Available. FINDINGS: There is no evidence of fracture or dislocation. Mild acromioclavicular spurring. There is no evidence of arthropathy or other focal bone abnormality. Soft tissues are unremarkable. IMPRESSION: 1. No acute findings. 2. Mild acromioclavicular spurring. Electronically Signed   By: Corlis Leak M.D.   On: 05/16/2023 09:11   DG Chest Port 1 View  Result Date: 05/16/2023 CLINICAL DATA:  L chest pain EXAM: PORTABLE CHEST 1 VIEW COMPARISON:  CXR 12/21/09 FINDINGS: No pleural effusion. No pneumothorax. Normal cardiac and mediastinal contours. No focal airspace opacity. No radiographically apparent displaced rib fractures. There are prominent bilateral interstitial opacities that could be seen in the setting of small airways disease or pulmonary venous congestion. No radiographically apparent displaced rib fractures. Visualized upper abdomen is unremarkable. IMPRESSION: Prominent bilateral interstitial opacities could be seen in the setting of small airways disease or pulmonary venous congestion. Electronically Signed   By: Lorenza Cambridge M.D.   On: 05/16/2023 08:47    Procedures Procedures    Medications Ordered in ED Medications - No data to display  ED Course/ Medical Decision Making/ A&P                             Medical Decision Making Amount and/or Complexity of Data Reviewed Labs: ordered. Radiology: ordered.   Emma Ritter 52 y.o. presented today for chest pain. Working DDx that I considered at this time includes, but not limited to, ACS, GERD, pulmonary embolism, community-acquired pneumonia, aortic dissection, pneumothorax, underlying bony abnormality, anemia, thyrotoxicosis, esophageal rupture.    R/o Dx: ACS, pulmonary embolism, community-acquired pneumonia, aortic dissection, pneumothorax, underlying bony abnormality, anemia, thyrotoxicosis, esophageal rupture, CHF: These are considered less likely due to history of present  illness and physical exam findings.  PE: PERC 0 Aortic Dissection: less likely based on the location, quality, onset, and severity of symptoms in this case. Patient also has a lack of underlying history of AD or TAA.   Review of prior external notes: 03/21/2015 ED  Unique Tests and My Interpretation:  EKG: Rate, rhythm, axis, intervals all examined and without medically relevant abnormality. ST segments without concerns for elevations Troponin: 3 CXR: Possible pulmonary vascular congestion CBC: Unremarkable BMP: Unremarkable BNP: Unremarkable  Discussion with Independent Historian:  Husband  Discussion of Management of Tests: None  Risk: Low: based on diagnostic testing/clinical impression and treatment plan  Risk Stratification Score: PERC: 0  Plan: Patient presented for chest pain. On exam patient was in no acute distress and had stable vitals. Patient's physical was remarkable for being able to reproduce patient's chest pain with palpation  to the left side of her chest wall and left shoulder.  Patient has good range of motion in the left shoulder but stated that moving her shoulder exacerbated her pain.  Patient good pulse motor sensation and cap refill.  There is patient's physical exam was unremarkable.  Labs and CXR will be ordered. Patient stable at this time.  Patient's labs are unremarkable.  Patient's chest x-ray showed possible pulmonary vascular congestion however patient is not clear to auscultation and patient denies any history of respiratory illness.  Patient is not hypoxic or requiring oxygen or fluid overloaded at this time and so antibiotics or diuretics will be withheld.  Patient's left shoulder x-ray did show AC spurring which could explain patient's left shoulder pain.  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  Due to patient's chest pain been  present for 3 days on 1 troponin was needed and patient's for troponin was 3.  Do not suspect ACS at this time.  Patient will be given primary care follow-up along with GI follow-up as she does not see either in the past year.  I encouraged patient to use Tylenol every 6 hours as needed for pain and to continue using her Pepcid as prescribed.  Patient was given return precautions. Patient stable for discharge at this time.  Patient verbalized understanding of plan.         Final Clinical Impression(s) / ED Diagnoses Final diagnoses:  Chest wall pain    Rx / DC Orders ED Discharge Orders     None         Remi Deter 05/16/23 1033    Tegeler, Canary Brim, MD 05/16/23 1039

## 2023-06-02 ENCOUNTER — Other Ambulatory Visit: Payer: Self-pay

## 2023-06-02 ENCOUNTER — Emergency Department (HOSPITAL_COMMUNITY)
Admission: EM | Admit: 2023-06-02 | Discharge: 2023-06-02 | Disposition: A | Discharge status: Home / Self Care | Payer: BC Managed Care – PPO | Attending: Emergency Medicine | Admitting: Emergency Medicine

## 2023-06-02 DIAGNOSIS — R1013 Epigastric pain: Secondary | ICD-10-CM | POA: Diagnosis present

## 2023-06-02 LAB — COMPREHENSIVE METABOLIC PANEL
ALT: 16 U/L (ref 0–44)
AST: 22 U/L (ref 15–41)
Albumin: 3.6 g/dL (ref 3.5–5.0)
Alkaline Phosphatase: 91 U/L (ref 38–126)
Anion gap: 13 (ref 5–15)
BUN: 10 mg/dL (ref 6–20)
CO2: 22 mmol/L (ref 22–32)
Calcium: 9.3 mg/dL (ref 8.9–10.3)
Chloride: 101 mmol/L (ref 98–111)
Creatinine, Ser: 0.9 mg/dL (ref 0.44–1.00)
GFR, Estimated: >60 mL/min (ref >60)
Glucose, Bld: 96 mg/dL (ref 70–99)
Potassium: 3.9 mmol/L (ref 3.5–5.1)
Sodium: 136 mmol/L (ref 135–145)
Total Bilirubin: 0.8 mg/dL (ref 0.3–1.2)
Total Protein: 7.2 g/dL (ref 6.5–8.1)

## 2023-06-02 LAB — CBC WITH DIFFERENTIAL/PLATELET
Abs Immature Granulocytes: 0.01 10*3/uL (ref 0.00–0.07)
Basophils Absolute: 0.1 10*3/uL (ref 0.0–0.1)
Basophils Relative: 1 %
Eosinophils Absolute: 0.1 10*3/uL (ref 0.0–0.5)
Eosinophils Relative: 1 %
HCT: 43.2 % (ref 36.0–46.0)
Hemoglobin: 14.4 g/dL (ref 12.0–15.0)
Immature Granulocytes: 0 %
Lymphocytes Relative: 29 %
Lymphs Abs: 2.3 10*3/uL (ref 0.7–4.0)
MCH: 30 pg (ref 26.0–34.0)
MCHC: 33.3 g/dL (ref 30.0–36.0)
MCV: 90 fL (ref 80.0–100.0)
Monocytes Absolute: 0.7 10*3/uL (ref 0.1–1.0)
Monocytes Relative: 9 %
Neutro Abs: 4.8 10*3/uL (ref 1.7–7.7)
Neutrophils Relative %: 60 %
Platelets: 247 10*3/uL (ref 150–400)
RBC: 4.8 MIL/uL (ref 3.87–5.11)
RDW: 12.8 % (ref 11.5–15.5)
WBC: 7.9 10*3/uL (ref 4.0–10.5)
nRBC: 0 % (ref 0.0–0.2)

## 2023-06-02 LAB — LIPASE, BLOOD: Lipase: 31 U/L (ref 11–51)

## 2023-06-02 MED ORDER — SODIUM CHLORIDE 0.9 % IV BOLUS
1000.0000 mL | Freq: Once | INTRAVENOUS | Status: AC
  Administered 2023-06-02: 1000 mL via INTRAVENOUS

## 2023-06-02 MED ORDER — SUCRALFATE 1 G PO TABS: 1.0000 g | ORAL_TABLET | Freq: Three times a day (TID) | ORAL | 0 refills | Status: DC

## 2023-06-02 MED ORDER — SUCRALFATE 1 G PO TABS: 1.0000 g | ORAL_TABLET | Freq: Three times a day (TID) | ORAL | 0 refills | Status: AC

## 2023-06-02 MED ORDER — OMEPRAZOLE 20 MG PO CPDR: 20.0000 mg | DELAYED_RELEASE_CAPSULE | Freq: Every day | ORAL | 0 refills | Status: AC

## 2023-06-02 MED ORDER — OMEPRAZOLE 20 MG PO CPDR: 20.0000 mg | DELAYED_RELEASE_CAPSULE | Freq: Every day | ORAL | 0 refills | Status: DC

## 2023-06-02 MED ORDER — FAMOTIDINE IN NACL 20-0.9 MG/50ML-% IV SOLN
20.0000 mg | Freq: Once | INTRAVENOUS | Status: AC
  Administered 2023-06-02: 20 mg via INTRAVENOUS
  Filled 2023-06-02: qty 50

## 2023-06-02 MED ORDER — SUCRALFATE 1 GM/10ML PO SUSP
1.0000 g | Freq: Once | ORAL | Status: AC
  Administered 2023-06-02: 1 g via ORAL
  Filled 2023-06-02: qty 10

## 2023-06-02 NOTE — ED Triage Notes (Signed)
Patient reports upper abdominal pain x several months, but worsening over the last month. Reports taking 15 Pepcid per day d/t pain. Endorses n/v and pain is more severe after PO intake or when lying down at night.

## 2023-06-02 NOTE — Discharge Instructions (Signed)
As discussed, today's evaluation has been generally reassuring.  However, to the point that you follow-up with both a gastroenterologist and primary care physician.  You have to contact numbers for our affiliated gastroenterology teams.  Please call today for the next available appointment.  In addition, please schedule follow-up at our health and wellness center to ensure that the medications you have been provided in the interim are sufficient. Return here for concerning changes in your condition.

## 2023-06-02 NOTE — ED Provider Notes (Signed)
[place] EMERGENCY DEPARTMENT AT [facility] Provider Note   CSN: [id number] Arrival date & time: 06/02/23  4098     History  Chief Complaint  Patient presents with   Abdominal Pain    Emma Ritter is a 52 y.o. female.  HPI Resents with epigastric pain.  Pain has been ongoing for months, worsening over the past few weeks.  Patient notes that she is tried multiple medication including Pepcid, Maalox, without relief.  She notes a history in the distant past of what sounds to be ulcerative disease.  She has not followed up with a GI doctor recently. No upper chest pain, no lower abdominal pain, no syncope, no fever.    Home Medications Prior to Admission medications   Medication Sig Start Date End Date Taking? Authorizing Provider  omeprazole (PRILOSEC) 20 MG capsule Take 1 capsule (20 mg total) by mouth daily. Take one tablet daily 06/02/23  Yes Gerhard Munch, MD  sucralfate (CARAFATE) 1 g tablet Take 1 tablet (1 g total) by mouth 4 (four) times daily -  with meals and at bedtime. 06/02/23  Yes Gerhard Munch, MD  azithromycin [facility]) 250 MG tablet Sig as indicated 12/29/18   Georgina Quint, MD      Allergies    Ciprofloxacin and Penicillins    Review of Systems   Review of Systems  All other systems reviewed and are negative.   Physical Exam Updated Vital Signs BP 128/75   Pulse (!) 55   Temp 97.8 F (36.6 C) (Oral)   Resp 16   Ht 5\' 4"  (1.626 m)   Wt 77 kg   LMP 05/14/2023 (Exact Date)   SpO2 100%   BMI 29.14 kg/m  Physical Exam Vitals and nursing note reviewed.  Constitutional:      General: She is not in acute distress.    Appearance: She is well-developed.  HENT:     Head: Normocephalic and atraumatic.  Eyes:     Conjunctiva/sclera: Conjunctivae normal.  Cardiovascular:     Rate and Rhythm: Normal rate and regular rhythm.  Pulmonary:     Effort: Pulmonary effort is normal. No respiratory distress.     Breath sounds:  Normal breath sounds. No stridor.  Abdominal:     General: There is no distension.     Tenderness: There is abdominal tenderness in the epigastric area.  Skin:    General: Skin is warm and dry.  Neurological:     Mental Status: She is alert and oriented to person, place, and time.     Cranial Nerves: No cranial nerve deficit.  Psychiatric:        Mood and Affect: Mood normal.     ED Results / Procedures / Treatments   Labs (all labs ordered are listed, but only abnormal results are displayed) Labs Reviewed  COMPREHENSIVE METABOLIC PANEL  LIPASE, BLOOD  CBC WITH DIFFERENTIAL/PLATELET    EKG None  Radiology No results found.  Procedures Procedures    Medications Ordered in ED Medications  sodium chloride 0.9 % bolus 1,000 mL (0 mLs Intravenous Stopped 06/02/23 1421)  famotidine (PEPCID) IVPB 20 mg premix (0 mg Intravenous Stopped 06/02/23 1420)  sucralfate (CARAFATE) 1 GM/10ML suspension 1 g (1 g Oral Given 06/02/23 1052)    ED Course/ Medical Decision Making/ A&P                             Medical Decision Making  Patient with a history of what sounds to have been gastritis presents with epigastric pain.  Given her history, recurrent gastritis versus gastroesophagitis or ACS are likely considerations.  Other considerations include pancreatitis. Cardiac 60 sinus normal Pulse ox 100% room air normal   Amount and/or Complexity of Data Reviewed Labs: ordered. Decision-making details documented in ED Course. Radiology: ordered and independent interpretation performed. Decision-making details documented in ED Course. ECG/medicine tests: ordered and independent interpretation performed. Decision-making details documented in ED Course.  Risk Prescription drug management. Decision regarding hospitalization.   2:27 PM Patient in no distress, awake, alert, stating that she is ready for discharge. We discussed all findings including 2 normal troponin, nonischemic EKG,  given the duration of symptoms low suspicion for atypical ACS. No evidence for pneumonia, bacteremia, sepsis. Some suspicion for gastroesophageal irritation, no evidence of perforation, with an otherwise soft, nonperitoneal abdomen, and with long duration of symptoms. Patient will start medications, will follow-up with GI, primary care.        Final Clinical Impression(s) / ED Diagnoses Final diagnoses:  Epigastric pain    Rx / DC Orders ED Discharge Orders          Ordered    sucralfate (CARAFATE) 1 g tablet  3 times daily with meals & bedtime       Note to Pharmacy: Take for one week   06/02/23 1427    omeprazole (PRILOSEC) 20 MG capsule  Daily        06/02/23 1427              Gerhard Munch, MD 06/02/23 1427

## 2023-06-23 ENCOUNTER — Inpatient Hospital Stay: Payer: BC Managed Care – PPO | Admitting: Physician Assistant
